# Patient Record
Sex: Male | Born: 2005 | Race: White | Hispanic: No | Marital: Single | Smoking: Never smoker
Health system: Southern US, Community
[De-identification: ages and names within clinical notes are randomized; demographics above are authoritative.]

---
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
## 2023-10-21 ENCOUNTER — Encounter: Admitting: Internal Medicine

## 2023-11-25 ENCOUNTER — Encounter: Payer: Self-pay | Admitting: Internal Medicine

## 2023-11-25 ENCOUNTER — Ambulatory Visit (INDEPENDENT_AMBULATORY_CARE_PROVIDER_SITE_OTHER): Admitting: Internal Medicine

## 2023-11-25 VITALS — BP 118/62 | HR 73 | Temp 99.1°F | Resp 16 | Ht 68.0 in | Wt 138.8 lb

## 2023-11-25 DIAGNOSIS — J029 Acute pharyngitis, unspecified: Secondary | ICD-10-CM | POA: Diagnosis not present

## 2023-11-25 DIAGNOSIS — D75839 Thrombocytosis, unspecified: Secondary | ICD-10-CM | POA: Diagnosis not present

## 2023-11-25 DIAGNOSIS — R509 Fever, unspecified: Secondary | ICD-10-CM | POA: Diagnosis not present

## 2023-11-25 LAB — URINALYSIS, ROUTINE W REFLEX MICROSCOPIC
Bilirubin Urine: NEGATIVE
Hgb urine dipstick: NEGATIVE
Ketones, ur: NEGATIVE
Leukocytes,Ua: NEGATIVE
Nitrite: NEGATIVE
RBC / HPF: NONE SEEN (ref 0–?)
Specific Gravity, Urine: 1.01 (ref 1.000–1.030)
Total Protein, Urine: NEGATIVE
Urine Glucose: NEGATIVE
Urobilinogen, UA: 1 (ref 0.0–1.0)
pH: 8 (ref 5.0–8.0)

## 2023-11-25 LAB — CBC WITH DIFFERENTIAL/PLATELET
Basophils Absolute: 0 K/uL (ref 0.0–0.1)
Basophils Relative: 0.8 % (ref 0.0–3.0)
Eosinophils Absolute: 0.1 K/uL (ref 0.0–0.7)
Eosinophils Relative: 1.4 % (ref 0.0–5.0)
HCT: 47.3 % (ref 39.0–52.0)
Hemoglobin: 14.7 g/dL (ref 13.0–17.0)
Lymphocytes Relative: 19.3 % (ref 12.0–46.0)
Lymphs Abs: 1 K/uL (ref 0.7–4.0)
MCHC: 31 g/dL (ref 30.0–36.0)
MCV: 69.9 fl — ABNORMAL LOW (ref 78.0–100.0)
Monocytes Absolute: 0.8 K/uL (ref 0.1–1.0)
Monocytes Relative: 14.7 % — ABNORMAL HIGH (ref 3.0–12.0)
Neutro Abs: 3.4 K/uL (ref 1.4–7.7)
Neutrophils Relative %: 63.8 % (ref 43.0–77.0)
Platelets: 500 K/uL — ABNORMAL HIGH (ref 150.0–400.0)
RBC: 6.77 Mil/uL — ABNORMAL HIGH (ref 4.22–5.81)
RDW: 14.2 % (ref 11.5–14.6)
WBC: 5.3 K/uL (ref 4.5–10.5)

## 2023-11-25 LAB — C-REACTIVE PROTEIN: CRP: <0.5 mg/dL (ref 0.5–20.0)

## 2023-11-25 LAB — BASIC METABOLIC PANEL WITH GFR
BUN: 11 mg/dL (ref 6–23)
CO2: 32 meq/L (ref 19–32)
Calcium: 10.4 mg/dL (ref 8.4–10.5)
Chloride: 98 meq/L (ref 96–112)
Creatinine, Ser: 0.91 mg/dL (ref 0.40–1.50)
GFR: 121.14 mL/min (ref >60.00)
Glucose, Bld: 85 mg/dL (ref 70–99)
Potassium: 4.9 meq/L (ref 3.5–5.1)
Sodium: 137 meq/L (ref 135–145)

## 2023-11-25 LAB — HEPATIC FUNCTION PANEL
ALT: 15 U/L (ref 0–53)
AST: 20 U/L (ref 0–37)
Albumin: 5.3 g/dL — ABNORMAL HIGH (ref 3.5–5.2)
Alkaline Phosphatase: 76 U/L (ref 39–117)
Bilirubin, Direct: 0.2 mg/dL (ref 0.0–0.3)
Total Bilirubin: 0.8 mg/dL (ref 0.2–1.2)
Total Protein: 8 g/dL (ref 6.0–8.3)

## 2023-11-25 NOTE — Progress Notes (Unsigned)
 Subjective:  Patient ID: Jonathan Weber, male    DOB: [birth year]  Age: 18 y.o. MRN: [id number]  CC: Sore Throat   HPI DAVARION CUFFEE presents for f/up   Discussed the use of AI scribe software for clinical note transcription with the patient, who gave verbal consent to proceed.  History of Present Illness Jonathan Weber is a 18 year old male who presents with a sore throat and fever.  He woke up today around 8:00 AM with a mild sore throat, which allows him to talk and move without significant pain. He reports he would not say he is lightheaded, but maybe felt a little dizzy. No cough, wheezing, chest pain, shortness of breath, abdominal pain, or testicular pain or swelling. His temperature was noted to be 99.32F, although he denies feeling feverish.  He has not received a flu shot this season but believes he has had a COVID vaccine, with the last one being in 2023 or 2022. He denies any penile discharge or painful urination. He is not currently sexually active, with the last sexual activity occurring three months ago, and describes past sexual practices as a mixture of safe and unsafe.     No outpatient medications prior to visit.   No facility-administered medications prior to visit.    ROS Review of Systems  Constitutional:  Positive for fever. Negative for appetite change, chills, diaphoresis and unexpected weight change.  HENT:  Positive for sore throat. Negative for facial swelling, postnasal drip, rhinorrhea, sinus pressure, trouble swallowing and voice change.   Eyes: Negative.   Respiratory:  Negative for cough, chest tightness, shortness of breath and wheezing.   Cardiovascular:  Negative for chest pain, palpitations and leg swelling.  Gastrointestinal: Negative.  Negative for abdominal pain, constipation, diarrhea, nausea and vomiting.  Endocrine: Negative.   Genitourinary: Negative.  Negative for decreased urine volume, dysuria, hematuria, penile discharge, penile  swelling, scrotal swelling, testicular pain and urgency.  Musculoskeletal: Negative.  Negative for arthralgias and myalgias.  Skin: Negative.  Negative for color change, pallor and rash.  Neurological: Negative.  Negative for dizziness and weakness.  Hematological:  Negative for adenopathy. Does not bruise/bleed easily.  Psychiatric/Behavioral: Negative.      Objective:  BP 118/62 (BP Location: Left Arm, Patient Position: Sitting, Cuff Size: Normal)   Pulse 73   Temp 99.1 F (37.3 C) (Oral)   Resp 16   Ht 5' 8 (1.727 m)   Wt 138 lb 12.8 oz (63 kg)   SpO2 99%   BMI 21.10 kg/m   BP Readings from Last 3 Encounters:  11/25/23 118/62  03/05/21 112/76    Wt Readings from Last 3 Encounters:  11/25/23 138 lb 12.8 oz (63 kg)  03/05/21 142 lb (64.4 kg) (39%, Z= -0.27)*   * Growth percentiles are based on CDC (Boys, 2-20 Years) data.    Physical Exam Vitals reviewed.  Constitutional:      General: He is not in acute distress.    Appearance: He is well-developed. He is not ill-appearing, toxic-appearing or diaphoretic.  HENT:     Nose: Nose normal.     Mouth/Throat:     Mouth: Mucous membranes are moist.     Pharynx: Posterior oropharyngeal erythema present. No pharyngeal swelling or oropharyngeal exudate.     Tonsils: No tonsillar exudate or tonsillar abscesses. 0 on the right. 0 on the left.  Eyes:     General: No scleral icterus.    Conjunctiva/sclera: Conjunctivae normal.  Cardiovascular:  Rate and Rhythm: Normal rate and regular rhythm.     Heart sounds: No murmur heard.    No friction rub. No gallop.  Pulmonary:     Effort: Pulmonary effort is normal.     Breath sounds: No stridor. No wheezing or rales.  Abdominal:     General: Abdomen is flat.     Palpations: There is no mass.     Tenderness: There is no abdominal tenderness. There is no guarding.     Hernia: No hernia is present.  Musculoskeletal:        General: Normal range of motion.     Cervical back:  Neck supple.  Lymphadenopathy:     Cervical: No cervical adenopathy.  Skin:    General: Skin is warm and dry.     Findings: No erythema or rash.  Neurological:     General: No focal deficit present.     Mental Status: He is alert.  Psychiatric:        Mood and Affect: Mood normal.        Behavior: Behavior normal.     Lab Results  Component Value Date   WBC 5.3 11/25/2023   HGB 14.7 11/25/2023   HCT 47.3 11/25/2023   PLT 500.0 (H) 11/25/2023   GLUCOSE 85 11/25/2023   ALT 15 11/25/2023   AST 20 11/25/2023   NA 137 11/25/2023   K 4.9 11/25/2023   CL 98 11/25/2023   CREATININE 0.91 11/25/2023   BUN 11 11/25/2023   CO2 32 11/25/2023   TSH 0.81 03/05/2021    DG Chest 2 View Result Date: 02/27/2012 *RADIOLOGY REPORT* Clinical Data: Cough for 1 year CHEST - 2 VIEW Comparison: 05/01/2003 Findings: The lung fields appear mildly hyperexpanded.  Taking this into consideration the cardiomediastinal silhouette is within normal limits.  The lung fields appear clear with no signs of focal infiltrate or congestive failure.  No pleural fluid or significant peribronchial cuffing is identified. Bony structures appear intact. IMPRESSION: Mild hyperinflation with an otherwise unremarkable cardiopulmonary appearance Original Report Authenticated By: Asberry Berber, M.D.    Assessment & Plan:  Fever, unspecified fever cause- Viral URI. -     CBC with Differential/Platelet; Future -     Hepatic function panel; Future -     Urinalysis, Routine w reflex microscopic; Future -     C-reactive protein; Future -     Basic metabolic panel with GFR; Future -     C. trachomatis/N. gonorrhoeae RNA; Future -     POCT Influenza A/B -     POC COVID-19 BinaxNow -     POCT rapid strep A  Pharyngitis, unspecified etiology -     CBC with Differential/Platelet; Future -     Basic metabolic panel with GFR; Future -     C. trachomatis/N. gonorrhoeae RNA; Future  Thrombocytosis -     CBC with  Differential/Platelet; Future -     Hepatic function panel; Future -     C-reactive protein; Future -     Basic metabolic panel with GFR; Future -     Ambulatory referral to Hematology / Oncology     Follow-up: Return in about 4 weeks (around 12/23/2023).  Debby Molt, MD

## 2023-11-25 NOTE — Patient Instructions (Signed)

## 2023-11-26 ENCOUNTER — Ambulatory Visit: Payer: Self-pay | Admitting: Internal Medicine

## 2023-11-26 LAB — POCT INFLUENZA A/B
Influenza A, POC: NEGATIVE
Influenza B, POC: NEGATIVE

## 2023-11-26 LAB — POC COVID19 BINAXNOW: SARS Coronavirus 2 Ag: NEGATIVE

## 2023-11-26 LAB — TIQ-NTM

## 2023-11-26 LAB — C. TRACHOMATIS/N. GONORRHOEAE RNA
C. trachomatis RNA, TMA: NOT DETECTED
N. gonorrhoeae RNA, TMA: NOT DETECTED

## 2023-11-26 LAB — POCT RAPID STREP A (OFFICE): Rapid Strep A Screen: NEGATIVE

## 2024-01-23 ENCOUNTER — Encounter: Payer: Self-pay | Admitting: Hematology and Oncology

## 2024-01-23 ENCOUNTER — Inpatient Hospital Stay: Attending: Hematology and Oncology | Admitting: Hematology and Oncology

## 2024-01-23 ENCOUNTER — Inpatient Hospital Stay

## 2024-01-23 VITALS — HR 71 | Temp 98.1°F | Resp 18 | Ht 68.0 in | Wt 139.0 lb

## 2024-01-23 DIAGNOSIS — D573 Sickle-cell trait: Secondary | ICD-10-CM | POA: Diagnosis not present

## 2024-01-23 DIAGNOSIS — D75839 Thrombocytosis, unspecified: Secondary | ICD-10-CM

## 2024-01-23 LAB — CBC WITH DIFFERENTIAL (CANCER CENTER ONLY)
Abs Immature Granulocytes: 0.01 K/uL (ref 0.00–0.07)
Basophils Absolute: 0 K/uL (ref 0.0–0.1)
Basophils Relative: 1 %
Eosinophils Absolute: 0.1 K/uL (ref 0.0–0.5)
Eosinophils Relative: 3 %
HCT: 46.3 % (ref 39.0–52.0)
Hemoglobin: 14.5 g/dL (ref 13.0–17.0)
Immature Granulocytes: 0 %
Lymphocytes Relative: 42 %
Lymphs Abs: 1.4 K/uL (ref 0.7–4.0)
MCH: 21.7 pg — ABNORMAL LOW (ref 26.0–34.0)
MCHC: 31.3 g/dL (ref 30.0–36.0)
MCV: 69.2 fL — ABNORMAL LOW (ref 80.0–100.0)
Monocytes Absolute: 0.5 K/uL (ref 0.1–1.0)
Monocytes Relative: 17 %
Neutro Abs: 1.2 K/uL — ABNORMAL LOW (ref 1.7–7.7)
Neutrophils Relative %: 37 %
Platelet Count: 423 K/uL — ABNORMAL HIGH (ref 150–400)
RBC: 6.69 MIL/uL — ABNORMAL HIGH (ref 4.22–5.81)
RDW: 15.5 % (ref 11.5–15.5)
WBC Count: 3.2 K/uL — ABNORMAL LOW (ref 4.0–10.5)
nRBC: 0 % (ref 0.0–0.2)

## 2024-01-23 LAB — IRON AND IRON BINDING CAPACITY (CC-WL,HP ONLY)
Iron: 138 ug/dL (ref 45–182)
Saturation Ratios: 39 % (ref 17.9–39.5)
TIBC: 358 ug/dL (ref 250–450)
UIBC: 220 ug/dL

## 2024-01-23 LAB — SEDIMENTATION RATE: Sed Rate: 2 mm/h (ref 0–16)

## 2024-01-23 LAB — FERRITIN: Ferritin: 41 ng/mL (ref 24–336)

## 2024-01-23 NOTE — Assessment & Plan Note (Signed)
 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

## 2024-01-23 NOTE — Assessment & Plan Note (Signed)
 The patient was found to have incidental thrombocytosis from routine blood draw starting in 2023 He is not symptomatic I will order additional workup It would be very rare to find myeloproliferative disorder at his age group
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
## 2024-01-25 LAB — HGB SOLUBILITY: Hgb Solubility: POSITIVE — AB

## 2024-01-25 LAB — HGB FRACTIONATION CASCADE
Hgb A2: 3.5 % — ABNORMAL HIGH (ref 1.8–3.2)
Hgb A: 68 % — ABNORMAL LOW (ref 96.4–98.8)
Hgb F: 0 % (ref 0.0–2.0)
Hgb S: 28.5 % — ABNORMAL HIGH

## 2024-02-02 LAB — ALPHA-THALASSEMIA ANALYSIS: IMAGE: 0

## 2024-02-09 LAB — MISC LABCORP TEST (SEND OUT): LabCorp test name: 489615

## 2024-02-15 LAB — MPN/CML FISH

## 2024-02-20 ENCOUNTER — Inpatient Hospital Stay: Attending: Hematology and Oncology | Admitting: Hematology and Oncology

## 2024-02-20 VITALS — BP 119/66 | HR 55 | Temp 98.1°F | Resp 18 | Ht 68.0 in | Wt 140.0 lb

## 2024-02-20 DIAGNOSIS — E611 Iron deficiency: Secondary | ICD-10-CM | POA: Insufficient documentation

## 2024-02-20 DIAGNOSIS — D560 Alpha thalassemia: Secondary | ICD-10-CM | POA: Insufficient documentation

## 2024-02-20 DIAGNOSIS — D72819 Decreased white blood cell count, unspecified: Secondary | ICD-10-CM | POA: Diagnosis not present

## 2024-02-20 DIAGNOSIS — D573 Sickle-cell trait: Secondary | ICD-10-CM | POA: Diagnosis not present

## 2024-02-20 DIAGNOSIS — D75839 Thrombocytosis, unspecified: Secondary | ICD-10-CM

## 2024-02-20 NOTE — Assessment & Plan Note (Addendum)
 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

## 2024-02-20 NOTE — Assessment & Plan Note (Addendum)
 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

## 2024-02-20 NOTE — Progress Notes (Signed)
 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    Almarie Bedford, MD  INTERVAL HISTORY: he returns for surveillance follow-up and review of test results The patient has intermittent leukopenia, microcytosis, thrombocytosis He had extensive labs done I reviewed all test results with the patient  PHYSICAL EXAMINATION: ECOG PERFORMANCE STATUS: 0 - Asymptomatic  Vitals:   02/20/24 1453  BP: 119/66  Pulse: (!) 55  Resp: 18  Temp: 98.1 F (36.7 C)  SpO2: 100%   Filed Weights   02/20/24 1453  Weight: 140 lb (63.5 kg)    Relevant data reviewed during this visit included CBC, iron studies, alpha thalassemia gene analysis, next generation sequencing, hemoglobin electrophoresis

## 2024-02-20 NOTE — Assessment & Plan Note (Addendum)
 The patient has sickle cell trait We discussed inheritance pattern for sickle cell trait and importance of genetic screening in the future

## 2024-02-20 NOTE — Assessment & Plan Note (Addendum)
 The patient has alpha thalassemia detected on hemoglobin electrophoresis We discussed inheritance pattern and importance of genetic screening in the future prior to the patient having children

## 2024-02-20 NOTE — Assessment & Plan Note (Addendum)
 He has iron deficiency without anemia He does not need iron supplement We discussed importance of adding more meat in his diet and the reactive thrombocytosis should improve Next-generation sequencing rule out malignancy such as myeloproliferative disorder
